# Patient Record
Sex: Female | Born: 1975 | Race: White | Hispanic: No | Smoking: Never smoker
Health system: Southern US, Community
[De-identification: ages and names within clinical notes are randomized; demographics above are authoritative.]

## PROBLEM LIST (undated history)

## (undated) DIAGNOSIS — Z8742 Personal history of other diseases of the female genital tract: Secondary | ICD-10-CM

## (undated) HISTORY — DX: Personal history of other diseases of the female genital tract: Z87.42

---

## 2010-01-18 ENCOUNTER — Ambulatory Visit (HOSPITAL_COMMUNITY): Admission: RE | Admit: 2010-01-18 | Discharge: 2010-01-18 | Payer: Self-pay | Admitting: Obstetrics and Gynecology

## 2013-01-18 ENCOUNTER — Observation Stay: Payer: Self-pay | Admitting: Obstetrics & Gynecology

## 2013-01-18 ENCOUNTER — Inpatient Hospital Stay: Payer: Self-pay | Admitting: Obstetrics and Gynecology

## 2013-01-18 LAB — CBC WITH DIFFERENTIAL/PLATELET
Basophil #: 0 10*3/uL (ref 0.0–0.1)
Eosinophil #: 0.1 10*3/uL (ref 0.0–0.7)
Eosinophil %: 0.8 %
HCT: 37 % (ref 35.0–47.0)
HGB: 12.3 g/dL (ref 12.0–16.0)
Lymphocyte #: 1.5 10*3/uL (ref 1.0–3.6)
Lymphocyte %: 12.7 %
MCHC: 33.3 g/dL (ref 32.0–36.0)
MCV: 85 fL (ref 80–100)
Neutrophil #: 9.3 10*3/uL — ABNORMAL HIGH (ref 1.4–6.5)
Neutrophil %: 80.7 %
Platelet: 138 10*3/uL — ABNORMAL LOW (ref 150–440)

## 2013-01-19 LAB — CBC WITH DIFFERENTIAL/PLATELET
Basophil #: 0 10*3/uL (ref 0.0–0.1)
Basophil %: 0.1 %
Basophil %: 0.1 %
Eosinophil %: 0.1 %
Eosinophil %: 0 %
HCT: 25.8 % — ABNORMAL LOW (ref 35.0–47.0)
HGB: 8.8 g/dL — ABNORMAL LOW (ref 12.0–16.0)
HGB: 8.3 g/dL — ABNORMAL LOW (ref 12.0–16.0)
Lymphocyte #: 0.8 10*3/uL — ABNORMAL LOW (ref 1.0–3.6)
Lymphocyte %: 6.4 %
Lymphocyte %: 3.8 %
MCH: 28.8 pg (ref 26.0–34.0)
MCHC: 33.9 g/dL (ref 32.0–36.0)
MCHC: 34.4 g/dL (ref 32.0–36.0)
Monocyte #: 1.4 x10 3/mm — ABNORMAL HIGH (ref 0.2–0.9)
Monocyte %: 6.5 %
Neutrophil #: 15.4 10*3/uL — ABNORMAL HIGH (ref 1.4–6.5)
RBC: 3.04 10*6/uL — ABNORMAL LOW (ref 3.80–5.20)
RDW: 13.7 % (ref 11.5–14.5)
WBC: 17.5 10*3/uL — ABNORMAL HIGH (ref 3.6–11.0)

## 2013-01-20 LAB — CBC WITH DIFFERENTIAL/PLATELET
Basophil #: 0 10*3/uL (ref 0.0–0.1)
HCT: 21.3 % — ABNORMAL LOW (ref 35.0–47.0)
HGB: 7.2 g/dL — ABNORMAL LOW (ref 12.0–16.0)
Lymphocyte %: 10.5 %
MCH: 28.7 pg (ref 26.0–34.0)
MCV: 85 fL (ref 80–100)
Monocyte #: 0.9 x10 3/mm (ref 0.2–0.9)
Neutrophil #: 11.3 10*3/uL — ABNORMAL HIGH (ref 1.4–6.5)
Platelet: 99 10*3/uL — ABNORMAL LOW (ref 150–440)
RDW: 14.1 % (ref 11.5–14.5)
WBC: 13.7 10*3/uL — ABNORMAL HIGH (ref 3.6–11.0)

## 2013-01-21 LAB — HEMATOCRIT: HCT: 20.3 % — ABNORMAL LOW (ref 35.0–47.0)

## 2014-12-29 NOTE — Op Note (Signed)
PATIENT NAME:  Anne Charles, Anne Charles MR#:  [id number] DATE OF BIRTH:  1976/07/30  DATE OF PROCEDURE:  01/19/2013  PREOPERATIVE DIAGNOSES: 1.  Intrauterine pregnancy at 38 weeks 6 days.  2.  History of cesarean section x 2.  3.  Actively laboring.  POSTOPERATIVE DIAGNOSES: 1.  Intrauterine pregnancy at 38 weeks 6 days.  2.  History of cesarean section x 2.  3.  Actively laboring.   INDICATIONS:  A 39 year old G3, P2-0-0-2 who was 4 cm with history of previous cesarean section x 2.   PROCEDURE:  Repeat low transverse cesarean section.   SURGEON:  Verlin Grills, M.D.   ANESTHESIA:  Spinal.   COMPLICATIONS:  None.   ESTIMATED BLOOD LOSS:  900 mL.   IV FLUIDS:  800 mL crystalloid.   URINE OUTPUT:  200 mL clear yellow urine.   FINDINGS:  Uterine window seen with exceedingly thin lower uterine segment at risk for dehiscence or rupture in a subsequent pregnancy.  Normal tubes and ovaries bilaterally, mild adhesive disease of fascia of the rectus, adhesive disease of bladder to lower uterine segment.  Liveborn female infant, Apgars 9 and 9.  No meconium.  No nuchal cord.  Weight 3110 grams.  Pediatrics present for delivery.   MEDICATIONS:  2 grams Ancef preoperatively and 20 units dilute Pitocin following placental delivery.   DISPOSITION:  Mother to recovery room, infant to newborn nursery, both in stable condition.   PROCEDURE IN DETAIL:  The patient was taken to the operating room where she was given anesthesia via spinal.  She is laid supine on the table, prepped and draped in the standard sterile fashion.  Prior to incision, level of anesthesia was checked and found to be adequate.  The patient's previous Pfannenstiel incision was noted to be very low and overlying the pubic symphysis, so decision was made to use a new incision 2 cm above the pubic symphysis.  This was made in the skin using the knife and carried down sharply to the level of the rectus fascia.  The fascia was then  incised in the midline.  The rectus fascial incision was extended bilaterally with Mayo scissors.  The superior border of the rectus fascia was then grasped and elevated off the underlying muscles, bluntly dissected.  The median raphe was taken down with Mayo scissors and attention was turned to the inferior border of the rectus fascia and it was taken down in a similar manner.  The rectus muscles were then separated in the midline.  The peritoneum identified, elevated with hemostats, and incised with Metzenbaum scissors.  Peritoneal opening was extended manually by pulling, followed by further sharp dissection with Mayo scissors.  The bladder blade was then inserted and the vesicouterine peritoneum was identified, elevated and incised with Metzenbaum scissors and this incision was extended bilaterally and superiorly with Metzenbaum.  The bladder flap was created and the bladder blade was replaced.  The operator's hand was inserted into the abdomen to find the uterus in a nonrotated position.  Next, a low transverse incision was made in the uterus using the knife.  With a single swipe the uterine cavity was entered due to the exceedingly thin lower uterine segment and uterine window as above.  The hysterotomy was extended bluntly.  The operator's hand was inserted into the uterus to find infant in vertex presentation.  The vertex was grasped, flexed and brought to the level of the incision where baby was delivered atraumatically using fundal pressure.  Cord was  doubly clamped and cut and the baby was passed to waiting pediatrician.  Next, the placenta was delivered with fundal pressure.  The uterus was exteriorized and the uterine cavity was wiped free of all clots and debris.  Next, the hysterotomy was repaired with 0 Vicryl stitch in a running lock fashion followed by a second 0 Vicryl stitch in a horizontal imbricating fashion.  A small extension on the left lateral aspect of the hysterotomy was also repaired  with a 0 Vicryl stitch in a figure-of-eight to control hemostasis.  Further milder areas of oozing were controlled with the Bovie.  Anatomy was then visualized with findings noted above.  The uterus was replaced into the abdomen and the paracolic gutters were suctioned bilaterally.  The anterior border of the rectus fascia was then grasped and On-Q catheters were placed infraumbilically without difficulty and allowed to rest between the rectus muscles and fascia.  Due to some persistent oozing at the area of the left lateral aspect of the hysterotomy 1 gram of Arista was used with improved hemostasis.  Next, the fascia was closed with #1 Vicryl stitch in a continuous fashion.  The incision was copiously irrigated.  Hemostasis was achieved using the Bovie.  Skin was closed using Insorb staples and bandaged with Steri-Strips.  On-Q catheters were Dermabonded in place and secured with Steri-Strips and Tegaderm.  All counts were correct x 2.  The patient tolerated the procedure well and was taken to the recovery room in stable condition.     ____________________________ Ali LoweEryn K. Garnette GunnerStansbury Clipp, MD eks:ea D: 01/19/2013 01:41:18 ET T: 01/19/2013 02:35:06 ET JOB#: [id number]  cc: Weston SettleEryn K. Garnette GunnerStansbury Clipp, MD, <Dictator> Weston SettleERYN Lona KettleK STANSBURY CLIPP MD ELECTRONICALLY SIGNED 01/19/2013 5:35

## 2015-01-16 NOTE — H&P (Signed)
L&D Evaluation:  History:  HPI 39yo G3P2002 at 5731w5d by 1st trimester U/S presents with worsening contractions through the day today.  [facility]   Presents with contractions   Patient's Medical History No Chronic Illness   Patient's Surgical History Previous C-Section  x2   Medications Pre Natal Vitamins   Allergies NKDA   Social History none   Family History Non-Contributory   ROS:  ROS All systems were reviewed.  HEENT, CNS, GI, GU, Respiratory, CV, Renal and Musculoskeletal systems were found to be normal.   Exam:  Vital Signs 1st BP mildly elevated   General mild distress with contractions   Mental Status clear   Chest normal effort   Abdomen gravid, non-tender   Estimated Fetal Weight Average for gestational age   Pelvic 3-4cm / 5570 / -4   Mebranes Intact   FHT normal rate with no decels   Ucx every 3-485min   Skin dry   Lymph no lymphadenopathy   Impression:  Impression active labor, h/o C/S x 2   LTCS Consent: I have had an exceedingly long and careful discussion with this patient about her circumstances and options available. She understands that I have carefully evaluated the infant's fetal heart rate pattern, the probable time remaining in her labor and her reserve. We are at a point where one of them will need to take a potential risk, either she take the risk of a cesarean section or the infant take a risk that the fetal intolerance to labor is very significant with the potential for a real effect on the baby which will worsen if we allow labor to continue.  She also understands that interpretation of the FHT is not a precise science and that someone else might allow labor to continue for the present. Mutual decision made to proceed with abdominal surgery.  Fully informed consent obtained including the risks of anesthesia, hemorrhage, infection, injury to adjacent structures, bowel, bladder, and blood vessels..  Electronic Signatures: Garnette GunnerStansbury  Clipp, Ali LoweEryn K (MD)  (Signed 13-May-14 23:04)  Authored: L&D Evaluation, Consent   Last Updated: 13-May-14 23:04 by Garnette GunnerStansbury Clipp, Ali LoweEryn K (MD)

## 2017-05-22 ENCOUNTER — Ambulatory Visit: Payer: Self-pay | Admitting: Advanced Practice Midwife

## 2017-06-26 ENCOUNTER — Ambulatory Visit: Payer: Self-pay | Admitting: Certified Nurse Midwife

## 2017-07-28 ENCOUNTER — Ambulatory Visit: Payer: Self-pay | Admitting: Certified Nurse Midwife

## 2017-10-13 ENCOUNTER — Ambulatory Visit: Payer: Self-pay | Admitting: Obstetrics and Gynecology

## 2017-10-30 ENCOUNTER — Encounter: Payer: Self-pay | Admitting: Obstetrics and Gynecology

## 2017-10-30 ENCOUNTER — Ambulatory Visit (INDEPENDENT_AMBULATORY_CARE_PROVIDER_SITE_OTHER): Admitting: Obstetrics and Gynecology

## 2017-10-30 DIAGNOSIS — Z01419 Encounter for gynecological examination (general) (routine) without abnormal findings: Secondary | ICD-10-CM

## 2017-10-30 DIAGNOSIS — Z1231 Encounter for screening mammogram for malignant neoplasm of breast: Secondary | ICD-10-CM

## 2017-10-30 DIAGNOSIS — Z124 Encounter for screening for malignant neoplasm of cervix: Secondary | ICD-10-CM | POA: Diagnosis not present

## 2017-10-30 DIAGNOSIS — Z1239 Encounter for other screening for malignant neoplasm of breast: Secondary | ICD-10-CM

## 2017-10-30 NOTE — Patient Instructions (Signed)
Preventive Care 18-39 Years, Female Preventive care refers to lifestyle choices and visits with your health care provider that can promote health and wellness. What does preventive care include?  A yearly physical exam. This is also called an annual well check.  Dental exams once or twice a year.  Routine eye exams. Ask your health care provider how often you should have your eyes checked.  Personal lifestyle choices, including: ? Daily care of your teeth and gums. ? Regular physical activity. ? Eating a healthy diet. ? Avoiding tobacco and drug use. ? Limiting alcohol use. ? Practicing safe sex. ? Taking vitamin and mineral supplements as recommended by your health care provider. What happens during an annual well check? The services and screenings done by your health care provider during your annual well check will depend on your age, overall health, lifestyle risk factors, and family history of disease. Counseling Your health care provider may ask you questions about your:  Alcohol use.  Tobacco use.  Drug use.  Emotional well-being.  Home and relationship well-being.  Sexual activity.  Eating habits.  Work and work Statistician.  Method of birth control.  Menstrual cycle.  Pregnancy history.  Screening You may have the following tests or measurements:  Height, weight, and BMI.  Diabetes screening. This is done by checking your blood sugar (glucose) after you have not eaten for a while (fasting).  Blood pressure.  Lipid and cholesterol levels. These may be checked every 5 years starting at age 66.  Skin check.  Hepatitis C blood test.  Hepatitis B blood test.  Sexually transmitted disease (STD) testing.  BRCA-related cancer screening. This may be done if you have a family history of breast, ovarian, tubal, or peritoneal cancers.  Pelvic exam and Pap test. This may be done every 3 years starting at age 40. Starting at age 59, this may be done every 5  years if you have a Pap test in combination with an HPV test.  Discuss your test results, treatment options, and if necessary, the need for more tests with your health care provider. Vaccines Your health care provider may recommend certain vaccines, such as:  Influenza vaccine. This is recommended every year.  Tetanus, diphtheria, and acellular pertussis (Tdap, Td) vaccine. You may need a Td booster every 10 years.  Varicella vaccine. You may need this if you have not been vaccinated.  HPV vaccine. If you are 69 or younger, you may need three doses over 6 months.  Measles, mumps, and rubella (MMR) vaccine. You may need at least one dose of MMR. You may also need a second dose.  Pneumococcal 13-valent conjugate (PCV13) vaccine. You may need this if you have certain conditions and were not previously vaccinated.  Pneumococcal polysaccharide (PPSV23) vaccine. You may need one or two doses if you smoke cigarettes or if you have certain conditions.  Meningococcal vaccine. One dose is recommended if you are age 27-21 years and a first-year college student living in a residence hall, or if you have one of several medical conditions. You may also need additional booster doses.  Hepatitis A vaccine. You may need this if you have certain conditions or if you travel or work in places where you may be exposed to hepatitis A.  Hepatitis B vaccine. You may need this if you have certain conditions or if you travel or work in places where you may be exposed to hepatitis B.  Haemophilus influenzae type b (Hib) vaccine. You may need this if  you have certain risk factors.  Talk to your health care provider about which screenings and vaccines you need and how often you need them. This information is not intended to replace advice given to you by your health care provider. Make sure you discuss any questions you have with your health care provider. Document Released: 10/21/2001 Document Revised: 05/14/2016  Document Reviewed: 06/26/2015 Elsevier Interactive Patient Education  [employer].

## 2017-10-30 NOTE — Progress Notes (Signed)
Patient ID: Anne Charles, female   DOB: November 11, 1975, 42 y.o.   MRN: [id number]     Gynecology Annual Exam   PCP: Patient, No Pcp Per  Chief Complaint:  Chief Complaint  Patient presents with  . Gynecologic Exam    History of Present Illness: Patient is a 42 y.o. G3P3003 presents for annual exam. The patient has no complaints today.   LMP: Patient's last menstrual period was 10/04/2017 (exact date). Average Interval: regular, 28 days Duration of flow: 5 days Heavy Menses: no Clots: no Intermenstrual Bleeding: no Postcoital Bleeding: no Dysmenorrhea: no  The patient is sexually active. She currently uses natural family planning for contraception. She denies dyspareunia.  The patient does perform self breast exams.  There is no notable family history of breast or ovarian cancer in her family.  The patient wears seatbelts: yes.   The patient has regular exercise: yes.    The patient denies current symptoms of depression.    Review of Systems: Review of Systems  Constitutional: Negative for chills and fever.  HENT: Negative for congestion.   Respiratory: Negative for cough and shortness of breath.   Cardiovascular: Negative for chest pain and palpitations.  Gastrointestinal: Negative for abdominal pain, constipation, diarrhea, heartburn, nausea and vomiting.  Genitourinary: Negative for dysuria, frequency and urgency.  Skin: Negative for itching and rash.  Neurological: Negative for dizziness and headaches.  Endo/Heme/Allergies: Negative for polydipsia.  Psychiatric/Behavioral: Negative for depression.    Past Medical History:  Past Medical History:  Diagnosis Date  . History of abnormal cervical Pap smear    LGSIL    Past Surgical History:  Past Surgical History:  Procedure Laterality Date  . CESAREAN SECTION      Gynecologic History:  Patient's last menstrual period was 10/04/2017 (exact date). 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 Cervical cancer Mother 40  . Hypertension Father     Social History:  Social History   Socioeconomic History  . Marital status: Unknown    Spouse name: Not on file  . Number of children: Not on file  . Years of education: Not on file  . Highest education level: Not on file  Social Needs  . Financial resource strain: Not on file  . Food insecurity - worry: Not on file  . Food insecurity - inability: Not on file  . Transportation needs - medical: Not on file  . Transportation needs - non-medical: Not on file  Occupational History  . Not on file  Tobacco Use  . Smoking status: Never Smoker  . Smokeless tobacco: Never Used  Substance and Sexual Activity  . Alcohol use: No    Frequency: Never  . Drug use: No  . Sexual activity: Yes    Birth control/protection: None  Other Topics Concern  . Not on file  Social History Narrative   Active duty military - army    Allergies:  No Known Allergies  Medications: Prior to Admission medications   Not on File    Physical Exam Vitals: Blood pressure 120/70, pulse 100, weight 148 lb (67.1 kg), last menstrual period 10/04/2017.  General: NAD HEENT: normocephalic, anicteric Thyroid: no enlargement, no palpable nodules Pulmonary: No increased work of breathing, CTAB  Cardiovascular: RRR, distal pulses 2+ Breast: Breast symmetrical, no tenderness, no palpable nodules or masses, no skin or nipple retraction present, no nipple discharge.  No axillary or supraclavicular lymphadenopathy. Abdomen: NABS, soft, non-tender, non-distended.  Umbilicus without lesions.  No hepatomegaly, splenomegaly or masses palpable. No evidence of hernia   Genitourinary:  External: Normal external female genitalia.  Normal urethral meatus, normal  Bartholin's and Skene's glands.    Vagina: Normal vaginal mucosa, no evidence of prolapse.    Cervix: Grossly normal in appearance, no bleeding  Uterus: Non-enlarged, mobile, normal contour.  No CMT  Adnexa: ovaries non-enlarged, no adnexal masses  Rectal: deferred  Lymphatic: no evidence of inguinal lymphadenopathy Extremities: no edema, erythema, or tenderness Neurologic: Grossly intact Psychiatric: mood appropriate, affect full  Female chaperone present for pelvic and breast  portions of the physical exam    Assessment: 42 y.o. G3P3003 routine annual exam  Plan: Problem List Items Addressed This Visit    None    Visit Diagnoses    Screening for malignant neoplasm of cervix       Relevant Orders   PapIG, HPV, rfx 16/18   Breast screening       Relevant Orders   MM DIGITAL SCREENING BILATERAL   Encounter for gynecological examination without abnormal finding          1) STI screening was not offered  2)  ASCCP guidelines and rational discussed.  Patient opts for yearly screening interval  3) Contraception - the patient is currently using  rhythm method.  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   Vena AustriaAndreas Tifanny Dollens, MD, Evern CoreFACOG [facility], [facility] 10/30/2017, 6:13 PM

## 2017-11-06 ENCOUNTER — Encounter: Payer: Self-pay | Admitting: Obstetrics and Gynecology

## 2017-11-06 LAB — PAPIG, HPV, RFX 16/18
HPV GENOTYPE, 18: NEGATIVE
HPV Genotype, 16: NEGATIVE
HPV, high-risk: POSITIVE — AB
PAP Smear Comment: 0

## 2017-11-27 ENCOUNTER — Ambulatory Visit (INDEPENDENT_AMBULATORY_CARE_PROVIDER_SITE_OTHER): Admitting: Obstetrics and Gynecology

## 2017-11-27 ENCOUNTER — Encounter: Payer: Self-pay | Admitting: Obstetrics and Gynecology

## 2017-11-27 VITALS — BP 102/62 | HR 76 | Ht 71.0 in | Wt 150.0 lb

## 2017-11-27 DIAGNOSIS — B977 Papillomavirus as the cause of diseases classified elsewhere: Secondary | ICD-10-CM | POA: Diagnosis not present

## 2017-11-27 DIAGNOSIS — N72 Inflammatory disease of cervix uteri: Secondary | ICD-10-CM | POA: Diagnosis not present

## 2017-11-27 NOTE — Progress Notes (Signed)
   GYNECOLOGY CLINIC COLPOSCOPY PROCEDURE NOTE  42 y.o. [id number] here for colposcopy for NIL and HR HPV+  pap smear on 11/06/17 (subtyping HPV 16 and 18 negative). Discussed underlying role for HPV infection in the development of cervical dysplasia, its natural history and progression/regression, need for surveillance.  Is the patient  pregnant: No LMP: Patient's last menstrual period was 11/01/2017. Smoking status:  reports that she has never smoked. She has never used smokeless tobacco. Contraception: abstinence Number current sexual partners:  1 High risk partner:No History of STD:  No Future fertility desired:  No  Patient given informed consent, signed copy in the chart, time out was performed.  The patient was position in dorsal lithotomy position. Speculum was placed the cervix was visualized.   After application of acetic acid colposcopic inspection of the cervix was undertaken.   Colposcopy adequate, full visualization of transformation zone: Yes acetowhite lesion(s) noted at 11-12 O'Clock o'clock; corresponding biopsies obtained.   ECC specimen obtained:  Yes  All specimens were labeled and sent to pathology.   Patient was given post procedure instructions.  Will follow up pathology and manage accordingly.  Routine preventative health maintenance measures emphasized.  OBGyn Exam  Vena AustriaAndreas Yisel Megill, MD, Merlinda FrederickFACOG [facility], [facility]

## 2017-12-02 LAB — PATHOLOGY

## 2017-12-04 ENCOUNTER — Encounter: Payer: Self-pay | Admitting: Obstetrics and Gynecology

## 2018-07-07 ENCOUNTER — Other Ambulatory Visit: Payer: Self-pay | Admitting: Obstetrics and Gynecology

## 2018-07-07 ENCOUNTER — Ambulatory Visit
Admission: RE | Admit: 2018-07-07 | Discharge: 2018-07-07 | Disposition: A | Discharge status: Home / Self Care | Source: Ambulatory Visit | Attending: Obstetrics and Gynecology | Admitting: Obstetrics and Gynecology

## 2018-07-07 DIAGNOSIS — R928 Other abnormal and inconclusive findings on diagnostic imaging of breast: Secondary | ICD-10-CM

## 2018-07-07 DIAGNOSIS — N6310 Unspecified lump in the right breast, unspecified quadrant: Secondary | ICD-10-CM

## 2018-07-07 DIAGNOSIS — Z1239 Encounter for other screening for malignant neoplasm of breast: Secondary | ICD-10-CM | POA: Insufficient documentation

## 2018-07-16 ENCOUNTER — Ambulatory Visit
Admission: RE | Admit: 2018-07-16 | Discharge: 2018-07-16 | Disposition: A | Discharge status: Home / Self Care | Source: Ambulatory Visit | Attending: Obstetrics and Gynecology | Admitting: Obstetrics and Gynecology

## 2018-07-16 DIAGNOSIS — N6310 Unspecified lump in the right breast, unspecified quadrant: Secondary | ICD-10-CM | POA: Insufficient documentation

## 2018-07-16 DIAGNOSIS — R928 Other abnormal and inconclusive findings on diagnostic imaging of breast: Secondary | ICD-10-CM | POA: Insufficient documentation

## 2019-02-03 ENCOUNTER — Other Ambulatory Visit: Payer: Self-pay | Admitting: Obstetrics and Gynecology

## 2019-02-03 DIAGNOSIS — N6310 Unspecified lump in the right breast, unspecified quadrant: Secondary | ICD-10-CM

## 2019-02-24 ENCOUNTER — Other Ambulatory Visit

## 2019-02-28 ENCOUNTER — Other Ambulatory Visit: Payer: Self-pay

## 2019-02-28 ENCOUNTER — Ambulatory Visit
Admission: RE | Admit: 2019-02-28 | Discharge: 2019-02-28 | Disposition: A | Discharge status: Home / Self Care | Source: Ambulatory Visit | Attending: Obstetrics and Gynecology | Admitting: Obstetrics and Gynecology

## 2019-02-28 DIAGNOSIS — N6310 Unspecified lump in the right breast, unspecified quadrant: Secondary | ICD-10-CM | POA: Diagnosis present

## 2019-07-25 IMAGING — US ULTRASOUND RIGHT BREAST LIMITED
1 series · 7 of 7 positions shown · non-contrast
Comparison: Baseline screening mammogram dated 07/07/2018.

CLINICAL DATA: Patient returns today to evaluate a RIGHT breast
mass identified on recent baseline screening mammogram.

EXAM:
ULTRASOUND OF THE RIGHT BREAST

[Series 1: ultrasound right breast limited · 0.06mm/px · 7 of 7 slices shown]
[im 1/7]
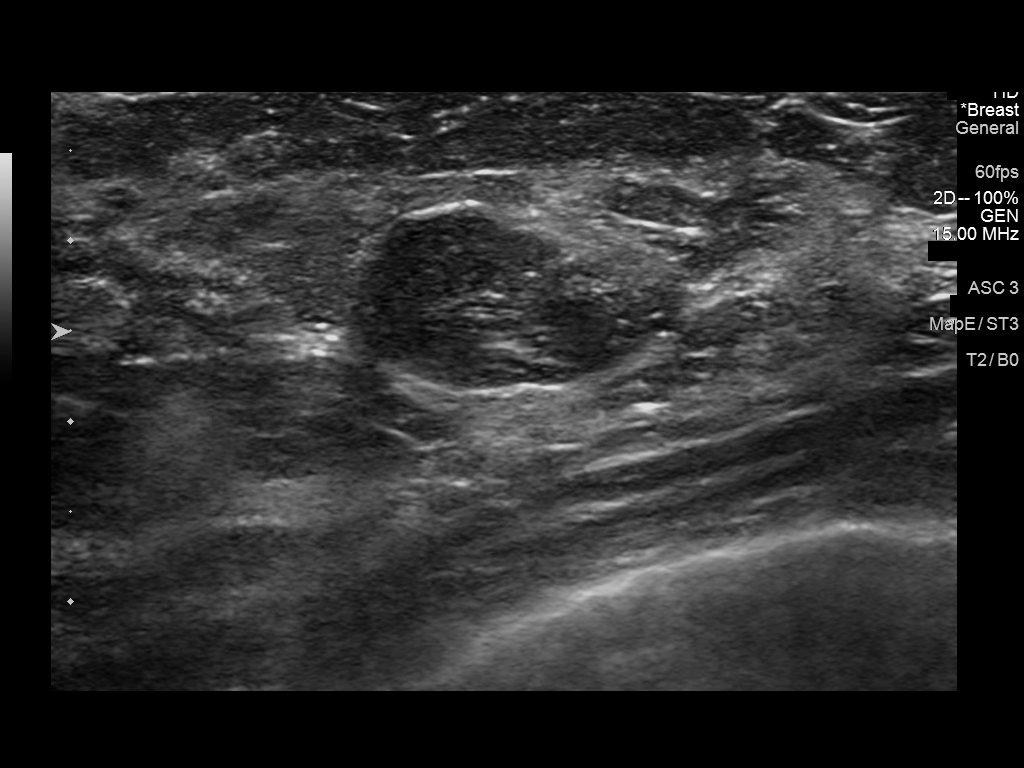
[im 2/7]
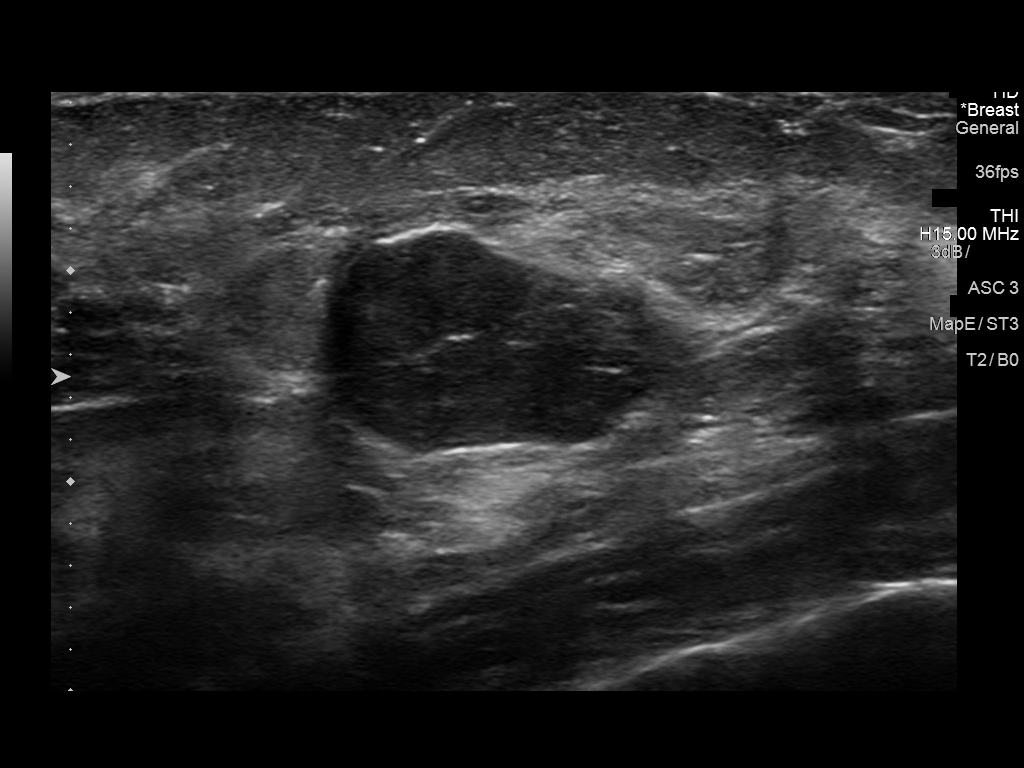
[im 3/7]
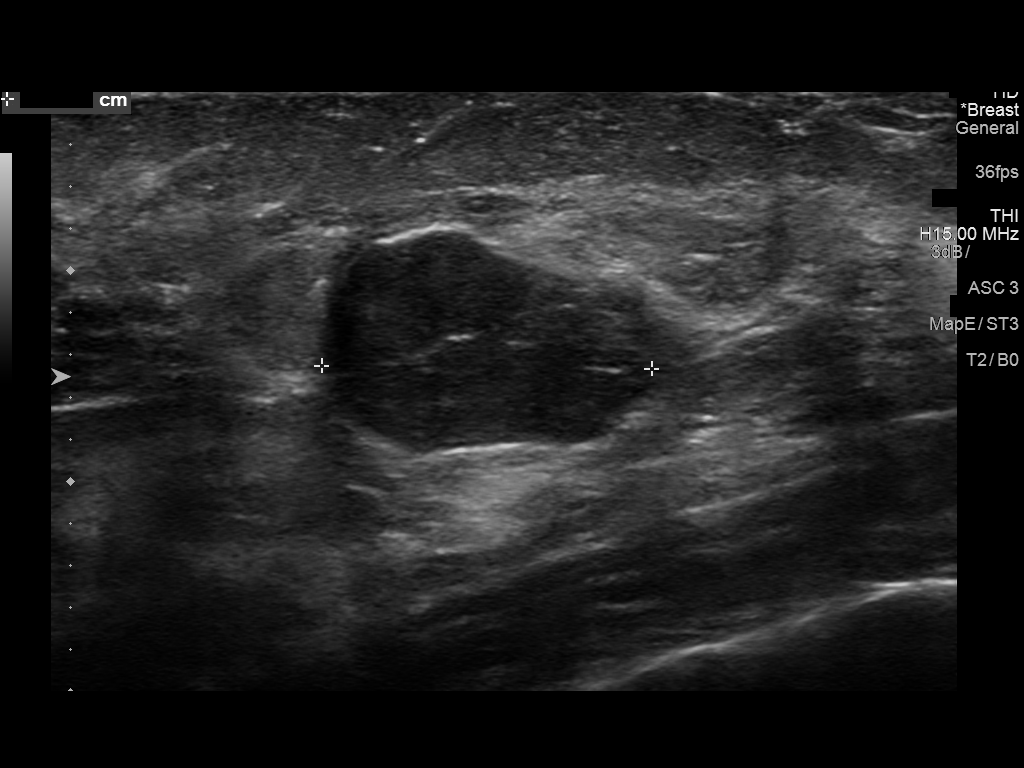
[im 4/7]
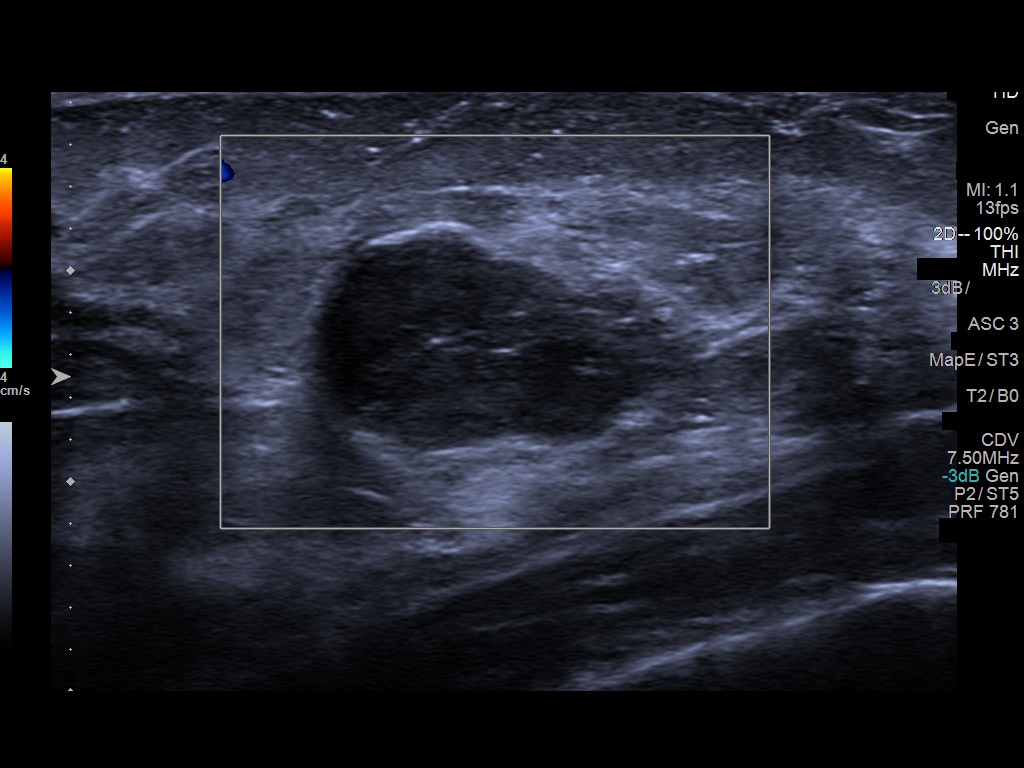
[im 5/7]
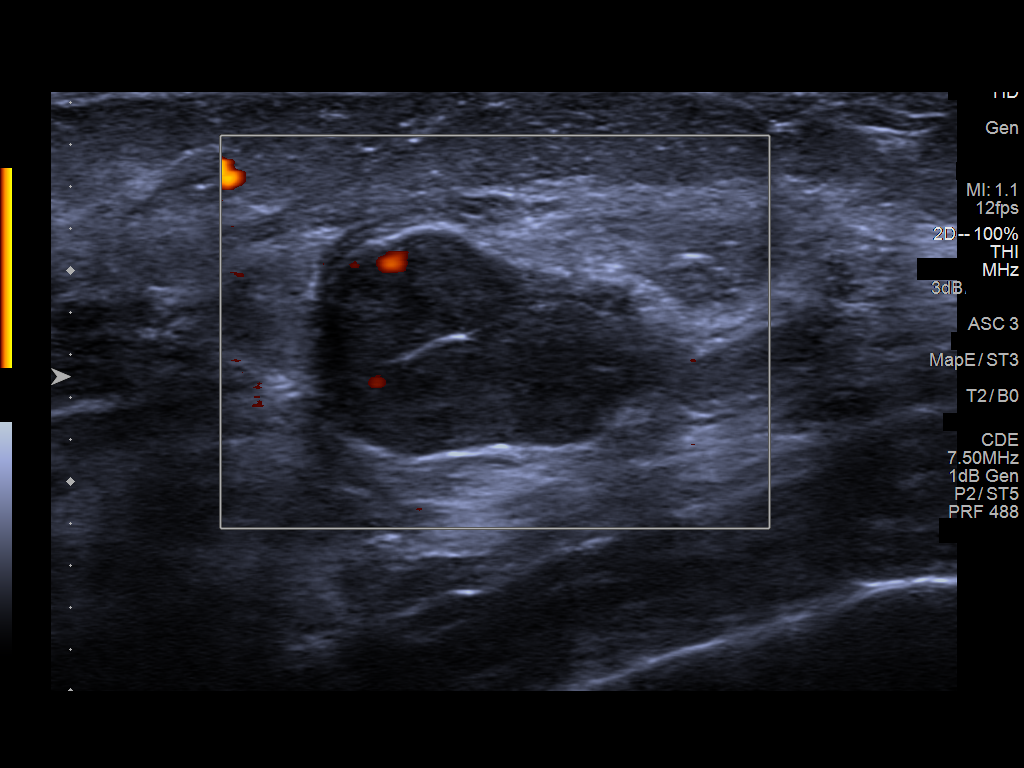
[im 6/7]
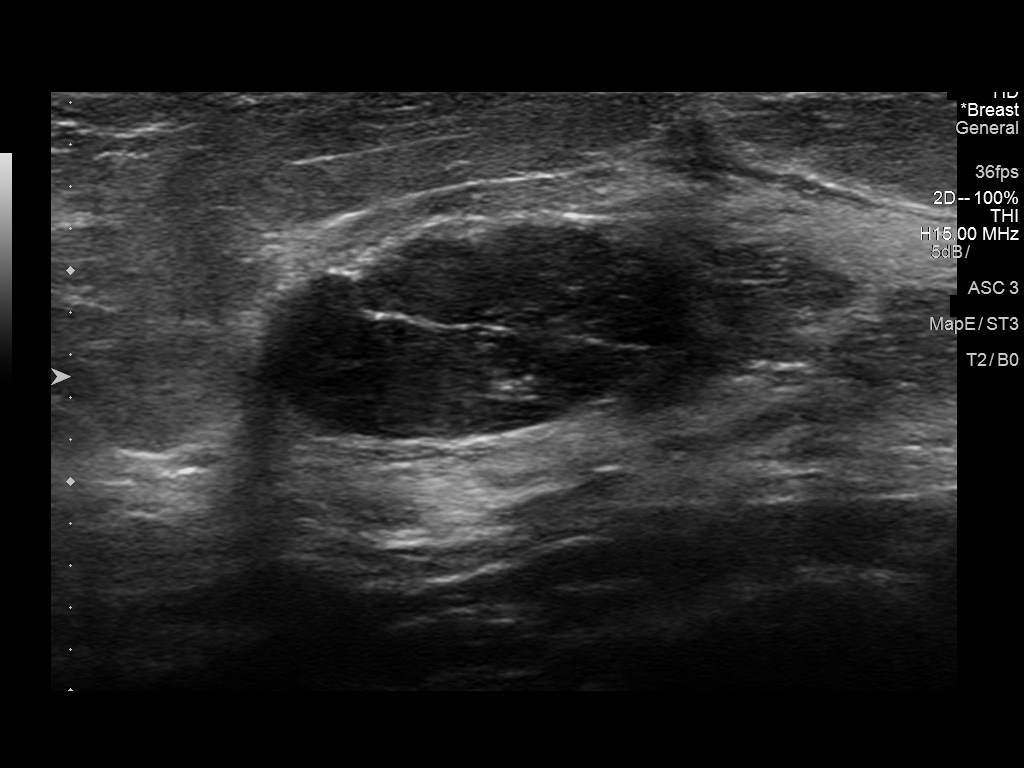
[im 7/7]
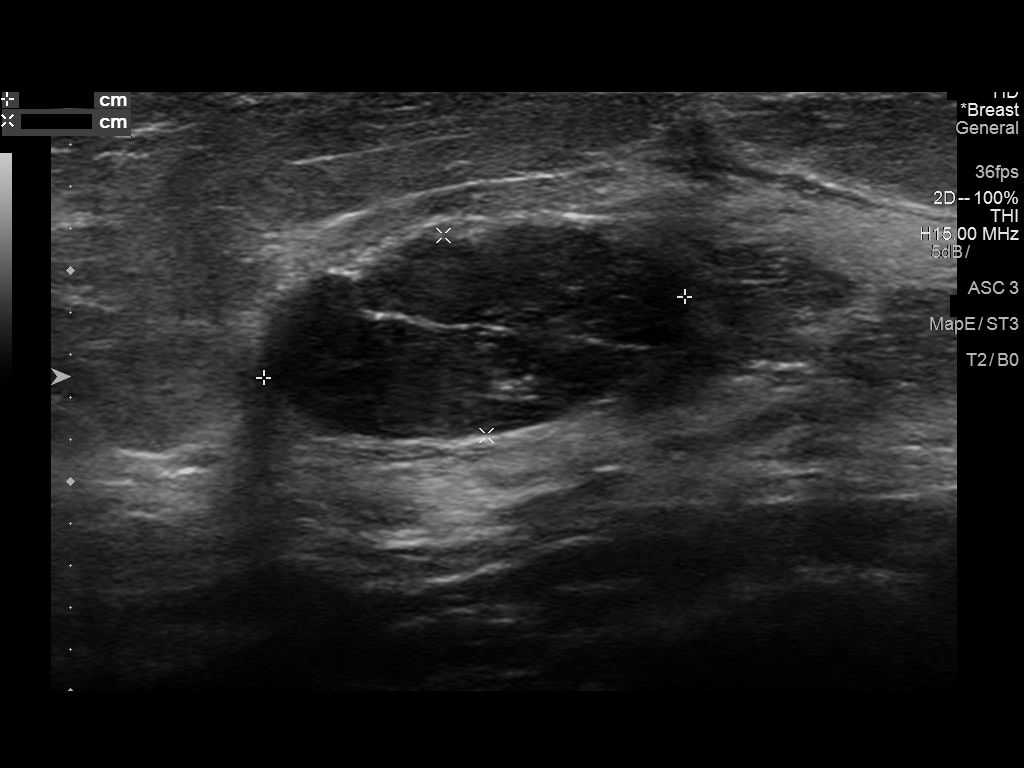

[7 of 7 positions shown; findings below may reference images not displayed]

FINDINGS: Targeted ultrasound is performed, showing an oval circumscribed
hypoechoic mass in the RIGHT breast at the 4 o'clock axis, 3 cm from
the nipple, measuring 2 x 1 x 1.6 cm, with internal septation,
corresponding to the mammographic finding, most suggestive of a
benign fibroadenoma.
IMPRESSION: Probably benign fibroadenoma within the RIGHT breast at the 4
o'clock axis, 3 cm from the nipple, measuring 2 x 1 x 1.6 cm,
corresponding to the finding on recent baseline screening mammogram.
Recommend follow-up RIGHT breast ultrasound in 6 months to ensure
stability.

RECOMMENDATION:
RIGHT breast ultrasound in 6 months.

I have discussed the findings and recommendations with the patient.
Results were also provided in writing at the conclusion of the
visit. If applicable, a reminder letter will be sent to the patient
regarding the next appointment.

BI-RADS CATEGORY  3: Probably benign.

## 2019-09-14 IMAGING — US ULTRASOUND RIGHT BREAST LIMITED
1 series · 6 of 6 positions shown · non-contrast
Comparison: Previous exam(s).

CLINICAL DATA: Patient presents for a diagnostic right breast
ultrasound to follow-up a probable benign mass at the 4 o'clock
position.

EXAM:
ULTRASOUND OF THE RIGHT BREAST

[Series 1: ultrasound right breast limited · 0.06mm/px · 6 of 6 slices shown]
[im 1/6]
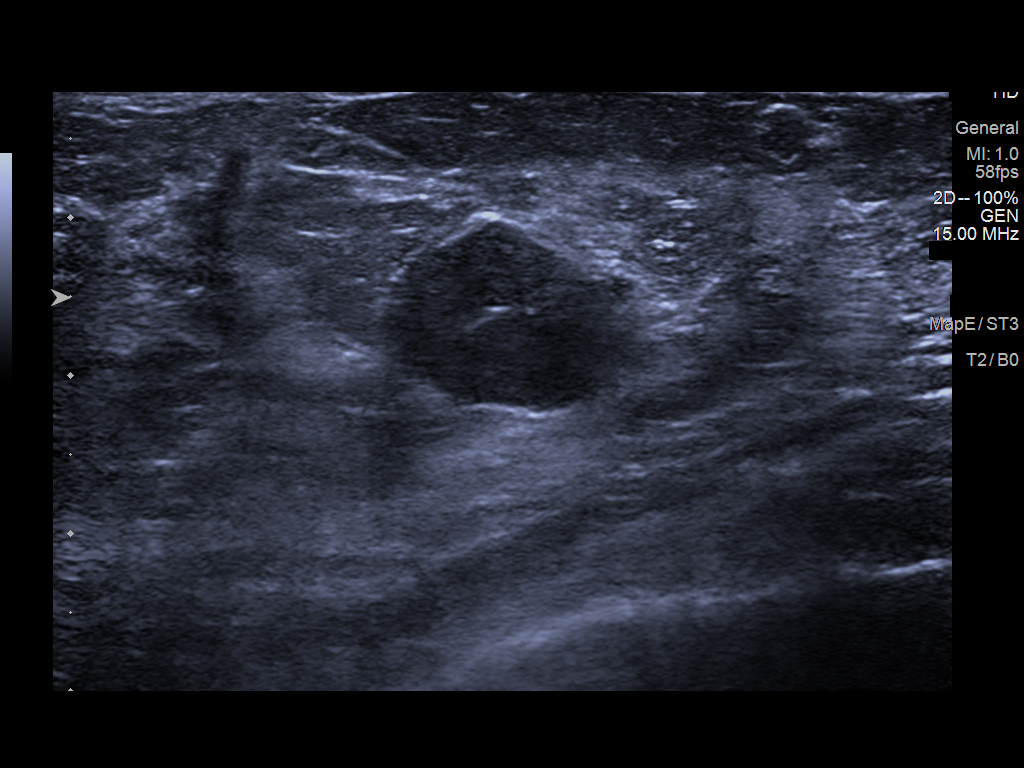
[im 2/6]
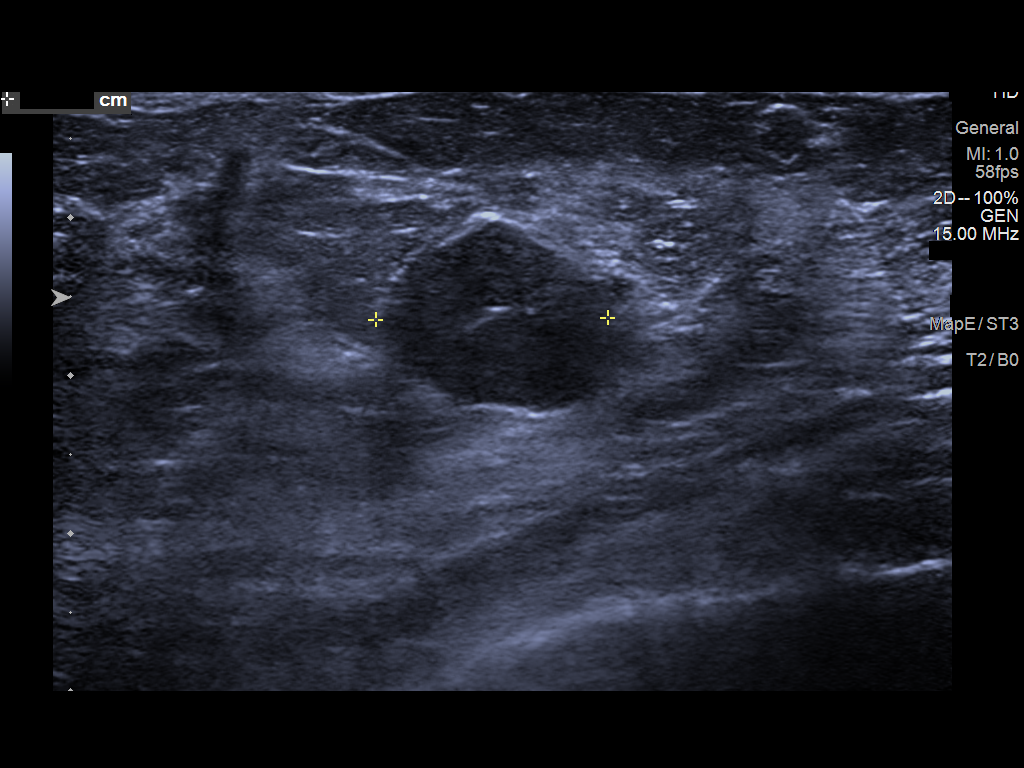
[im 3/6]
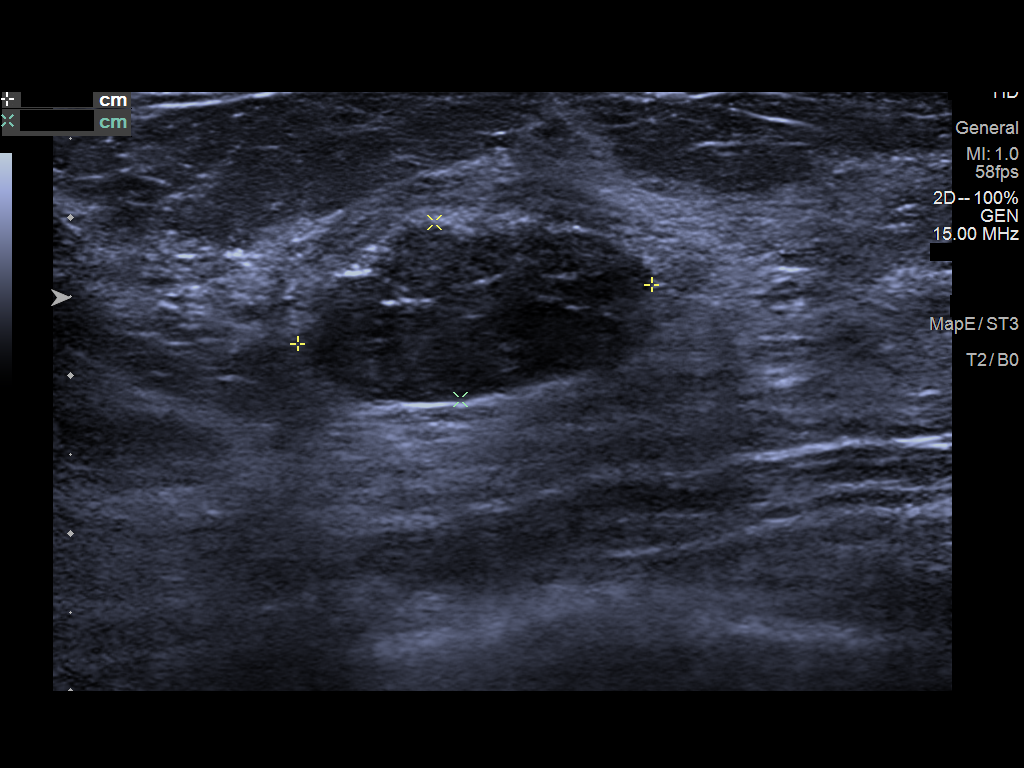
[im 4/6]
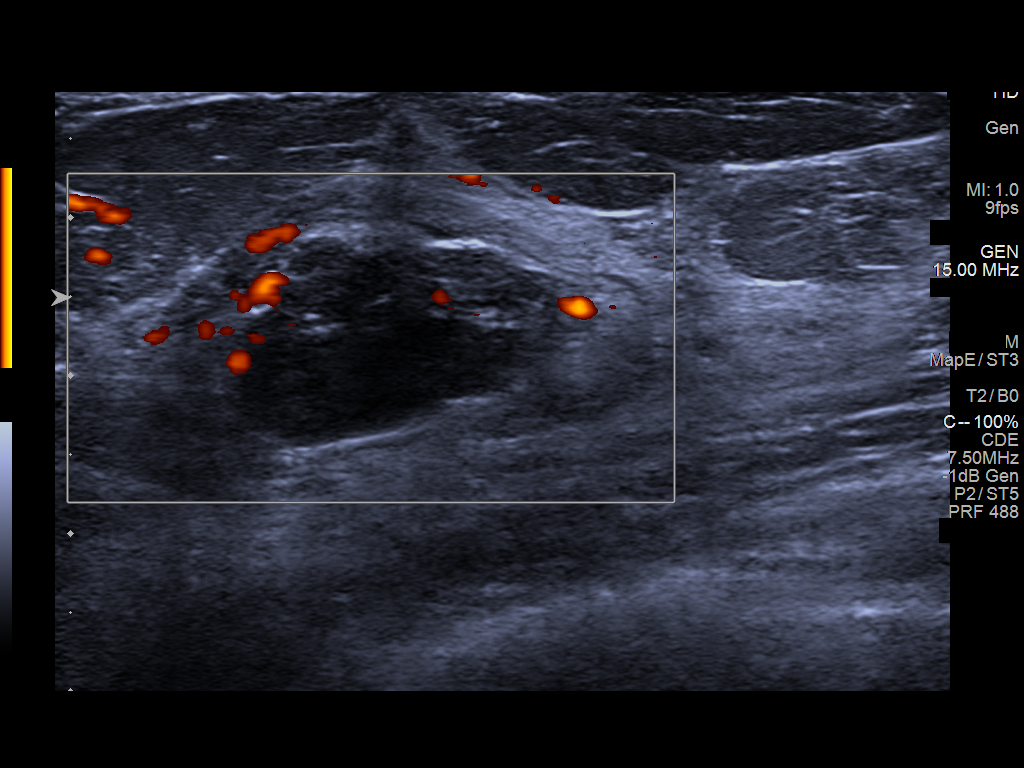
[im 5/6]
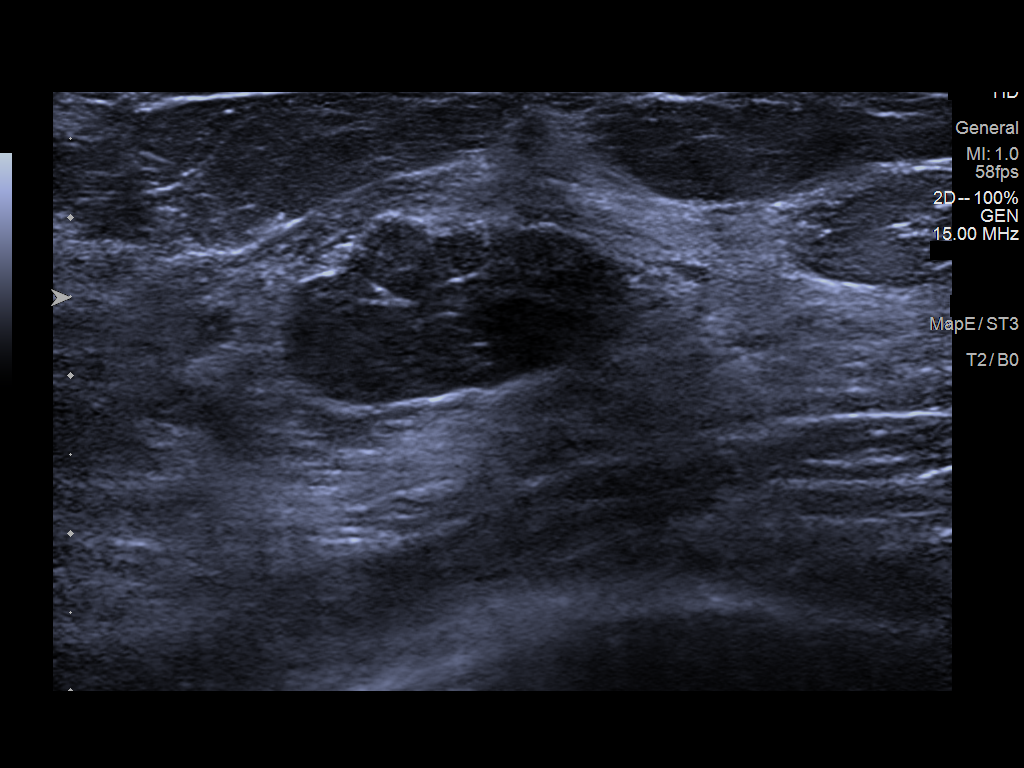
[im 6/6]
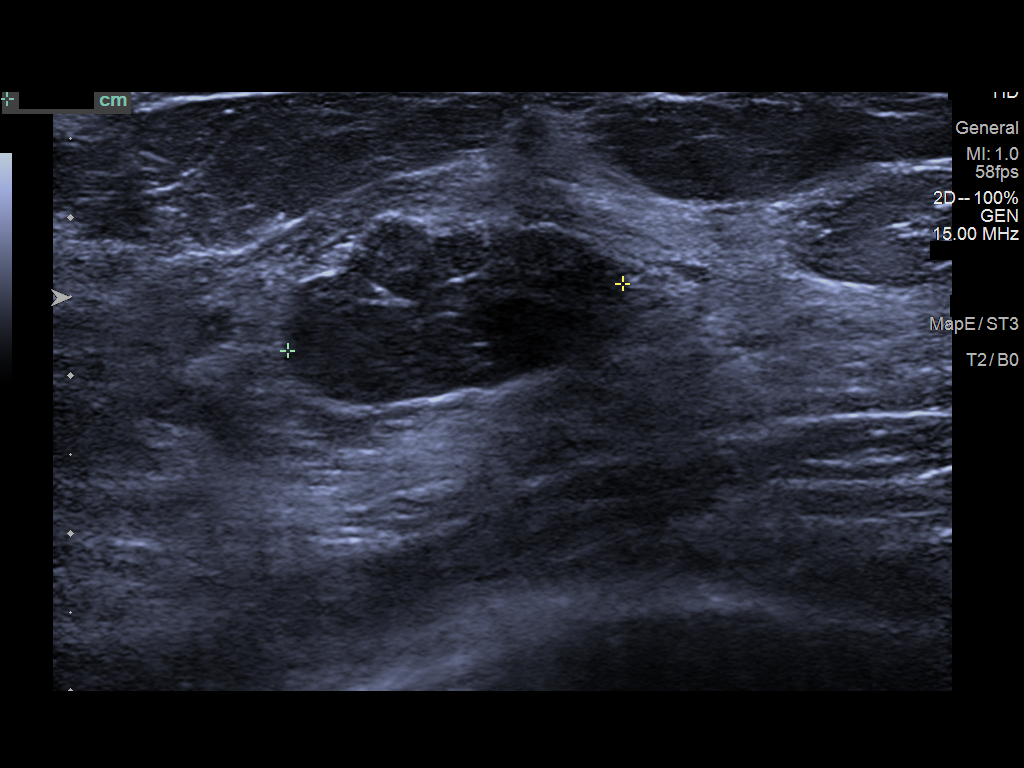

[6 of 6 positions shown; findings below may reference images not displayed]

FINDINGS: Targeted ultrasound is performed, showing continued evidence of an
oval circumscribed hypoechoic mass over the 4 o'clock position of
the right breast 3 cm from the nipple with a few thin septations and
mild increased through transmission. This mass measures 1.1 x 2.2 x
2.3 cm and is not significantly changed allowing for differences in
scan plane measurement. This likely represents a fibroadenoma.
IMPRESSION: No significant change in a probable benign mass over the 4 o'clock
position of the right breast likely a fibroadenoma.

RECOMMENDATION:
Recommend an additional six-month follow-up diagnostic right breast
ultrasound at the time of patient's annual bilateral mammogram in
June 2019 to document continued stability.

I have discussed the findings and recommendations with the patient.
Results were also provided in writing at the conclusion of the
visit. If applicable, a reminder letter will be sent to the patient
regarding the next appointment.

BI-RADS CATEGORY  3: Probably benign.

## 2020-08-24 ENCOUNTER — Ambulatory Visit: Admitting: Obstetrics and Gynecology

## 2020-09-03 ENCOUNTER — Other Ambulatory Visit (HOSPITAL_COMMUNITY)
Admission: RE | Admit: 2020-09-03 | Discharge: 2020-09-03 | Disposition: A | Discharge status: Home / Self Care | Source: Ambulatory Visit | Attending: Obstetrics and Gynecology | Admitting: Obstetrics and Gynecology

## 2020-09-03 ENCOUNTER — Encounter: Payer: Self-pay | Admitting: Obstetrics

## 2020-09-03 ENCOUNTER — Ambulatory Visit (INDEPENDENT_AMBULATORY_CARE_PROVIDER_SITE_OTHER): Admitting: Obstetrics

## 2020-09-03 ENCOUNTER — Other Ambulatory Visit: Payer: Self-pay

## 2020-09-03 VITALS — BP 110/70 | HR 96 | Ht 70.0 in | Wt 167.0 lb

## 2020-09-03 DIAGNOSIS — Z1231 Encounter for screening mammogram for malignant neoplasm of breast: Secondary | ICD-10-CM | POA: Diagnosis not present

## 2020-09-03 DIAGNOSIS — Z01419 Encounter for gynecological examination (general) (routine) without abnormal findings: Secondary | ICD-10-CM | POA: Insufficient documentation

## 2020-09-03 DIAGNOSIS — Z124 Encounter for screening for malignant neoplasm of cervix: Secondary | ICD-10-CM | POA: Diagnosis present

## 2020-09-03 NOTE — Progress Notes (Signed)
Gynecology Annual Exam  PCP: [facility] [place], [place]  Chief Complaint:  Chief Complaint  Patient presents with  . Annual Exam    History of Present Illness: Anne Charles is a 44 y.o. G3P3003 presents for annual exam. The patient has no complaints today. 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 Her husband is also a Occupational hygienist. They have three children.   Her menses are regular, they occur every month, and they last 5  days. Her flow is moderate. She does not have intermenstrual bleeding. Her last menstrual period was 08/20/2020. She denies dysmenorrhea. Last pap smear: 10/30/2017, results were NILM   The patient is  sexually active. She currently uses fertility awareness or Natural Family Planning for contraception. She does not have dyspareunia.  Since her last visit, she has had no significant changes in her health.  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  The patient does perform self breast exams. 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 This continues ot be followed with mammograms.  There is no family history of breast cancer. Genetic testing has not been done.   There is no family history of ovarian cancer. Genetic testing has not been done.  The patient denies smoking.  She reports drinking alcohol. She reports have drinks socially. drinks per week.   She denies illegal drug use.  The patient reports exercising regularly.  The patient denies current symptoms of depression.    Review of Systems: Review of Systems  Constitutional: Negative.   Eyes: Negative.   Cardiovascular: Negative.   Skin: Negative.   Neurological: Negative.   Endo/Heme/Allergies: Negative.   Psychiatric/Behavioral: Negative.     Past Medical History:  Past Medical History:  Diagnosis Date  . History of abnormal cervical Pap smear     LGSIL    Past Surgical History:  Past Surgical History:  Procedure Laterality Date  . CESAREAN SECTION      Family History:  Family History  Problem Relation Age of Onset  . Cervical cancer Mother 4  . Hypertension Father   . Breast cancer Neg Hx     Social History:  Social History   Socioeconomic History  . Marital status: Unknown    Spouse name: Not on file  . Number of children: Not on file  . Years of education: Not on file  . Highest education level: Not on file  Occupational History  . Not on file  Tobacco Use  . Smoking status: Never Smoker  . Smokeless tobacco: Never Used  Vaping Use  . Vaping Use: Never used  Substance and Sexual Activity  . Alcohol use: No  . Drug use: No  . Sexual activity: Yes    Birth control/protection: None  Other Topics Concern  . 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  General: NAD HEENT: normocephalic, anicteric Neck: no thyroid enlargement, no palpable nodules, no cervical lymphadenopathy  Pulmonary: No increased work of breathing, CTAB Cardiovascular: RRR, without murmur  Breast: Breast symmetrical, no tenderness, no palpable nodules or masses, no skin or nipple retraction present, no nipple discharge. Nipples are inverted.  No axillary, infraclavicular or supraclavicular lymphadenopathy. Abdomen: Soft, non-tender, non-distended.  Umbilicus without lesions.  No hepatomegaly or masses  palpable. No evidence of hernia. Genitourinary:  External: Normal external female genitalia.  Normal urethral meatus, normal  Bartholin's and Skene's glands.    Vagina: Normal vaginal mucosa, no evidence of prolapse.    Cervix: Grossly normal in appearance, no bleeding, non-tender  Uterus: Anteverted, normal size, shape, and consistency, mobile, and non-tender  Adnexa: No adnexal masses, non-tender  Rectal: deferred  Lymphatic: no evidence of inguinal lymphadenopathy Extremities: no edema, erythema, or tenderness Neurologic: Grossly intact Psychiatric: mood appropriate, affect full     Assessment: 44 y.o. [id number] for annual Well Woman Exam   Plan:  Well Woman physical completed  1) Breast cancer screening - recommend monthly self breast exam. Mammogram was ordered today.  2) STI screening was offered and declined.  3) Cervical cancer screening - Pap was done. ASCCP guidelines and rational discussed.  Patient opts for yearly screening interval  4) Contraception - Education given regarding options for contraception. She declines froma hormonal BC. Content with [name].  5) Routine healthcare maintenance including cholesterol and diabetes screening ordered today   She will return in one year for her next Annual.  [name], CNM  09/03/2020 5:51 PM

## 2020-09-05 ENCOUNTER — Encounter: Payer: Self-pay | Admitting: Obstetrics

## 2020-09-05 LAB — CYTOLOGY - PAP
Comment: NEGATIVE
Diagnosis: NEGATIVE
High risk HPV: NEGATIVE

## 2023-01-22 ENCOUNTER — Ambulatory Visit: Admitting: Certified Nurse Midwife

## 2023-01-30 ENCOUNTER — Other Ambulatory Visit (HOSPITAL_COMMUNITY)
Admission: RE | Admit: 2023-01-30 | Discharge: 2023-01-30 | Disposition: A | Discharge status: Home / Self Care | Source: Ambulatory Visit | Attending: Certified Nurse Midwife | Admitting: Certified Nurse Midwife

## 2023-01-30 ENCOUNTER — Ambulatory Visit (INDEPENDENT_AMBULATORY_CARE_PROVIDER_SITE_OTHER): Admitting: Certified Nurse Midwife

## 2023-01-30 VITALS — BP 123/74 | HR 70 | Ht 70.0 in | Wt 151.7 lb

## 2023-01-30 DIAGNOSIS — Z124 Encounter for screening for malignant neoplasm of cervix: Secondary | ICD-10-CM | POA: Diagnosis not present

## 2023-01-30 DIAGNOSIS — Z1151 Encounter for screening for human papillomavirus (HPV): Secondary | ICD-10-CM | POA: Insufficient documentation

## 2023-01-30 DIAGNOSIS — Z01419 Encounter for gynecological examination (general) (routine) without abnormal findings: Secondary | ICD-10-CM | POA: Diagnosis not present

## 2023-01-30 DIAGNOSIS — Z1239 Encounter for other screening for malignant neoplasm of breast: Secondary | ICD-10-CM

## 2023-01-30 NOTE — Progress Notes (Signed)
   ANNUAL EXAM Patient name: Anne Charles MRN [id number]  Date of birth: [birth year] Chief Complaint:   Gynecologic Exam  History of Present Illness:   Anne Charles is a 47 y.o. G25P3003 Caucasian female being seen today for a routine annual exam.  Current complaints: none  Patient's last menstrual period was 01/16/2023 (exact date).    Last pap 2021. Results were: NILM w/ HRHPV negative. H/O abnormal pap: yes-HPV positive 2019 Last mammogram: 2019. Results were: incomplete. Family h/o breast cancer: no Last colonoscopy: n/a. Results were: N/A. Family h/o colorectal cancer: no      No data to display               No data to display           Review of Systems:   Pertinent items are noted in HPI Denies any headaches, blurred vision, fatigue, shortness of breath, chest pain, abdominal pain, abnormal vaginal discharge/itching/odor/irritation, problems with periods, bowel movements, urination, or intercourse unless otherwise stated above. Pertinent History Reviewed:  Reviewed past medical,surgical, social and family history.  Reviewed problem list, medications and allergies. Physical Assessment:   Vitals:   01/30/23 0859  BP: 123/74  Pulse: 70  Weight: 151 lb 11.2 oz (68.8 kg)  Height: 5\' 10"  (1.778 m)  Body mass index is 21.77 kg/m.        Physical Examination:   General appearance - well appearing, and in no distress  Mental status - alert, oriented to person, place, and time  Psych:  She has a normal mood and affect  Skin - warm and dry, normal color, no suspicious lesions noted  Chest - effort normal, all lung fields clear to auscultation bilaterally  Heart - normal rate and regular rhythm  Neck:  midline trachea, no thyromegaly or nodules  Breasts - breasts appear normal, no suspicious masses, no skin or nipple changes or enlarged axillary nodes  Abdomen - soft, nontender, nondistended, no masses or organomegaly  Pelvic - VULVA: normal appearing  vulva with no masses, tenderness or lesions   VAGINA: normal appearing vagina with normal color and discharge, no lesions   CERVIX: normal appearing cervix without discharge or lesions  Thin prep pap is done with HR HPV cotesting  Extremities:  No swelling or varicosities noted  No results found for this or any previous visit (from the past 24 hour(s)).  Assessment & Plan:  1) Well-Woman Exam, pap collected. Mammogram ordered  2) Health promotion & maintenance for age reviewed. Calcium/vitamin D supplementation encouraged.   Orders Placed This Encounter  Procedures   MM DIGITAL SCREENING BILATERAL    Meds: No orders of the defined types were placed in this encounter.   Follow-up: Return in 1 year (on 01/30/2024) for Annual exam.  Dominica Severin, CNM 01/30/2023 9:37 AM

## 2023-01-30 NOTE — Patient Instructions (Signed)

## 2023-02-09 LAB — CYTOLOGY - PAP
Adequacy: ABSENT
Comment: NEGATIVE
Diagnosis: NEGATIVE
Diagnosis: REACTIVE
High risk HPV: NEGATIVE

## 2024-10-07 NOTE — Patient Instructions (Incomplete)
 Preventive Care 66-47 Years Old, Female Preventive care refers to lifestyle choices and visits with your health care provider that can promote health and wellness. Preventive care visits are also called wellness exams. What can I expect for my preventive care visit? Counseling Your health care provider may ask you questions about your: Medical history, including: Past medical problems. Family medical history. Pregnancy history. Current health, including: Menstrual cycle. Method of birth control. Emotional well-being. Home life and relationship well-being. Sexual activity and sexual health. Lifestyle, including: Alcohol, nicotine or tobacco, and drug use. Access to firearms. Diet, exercise, and sleep habits. Work and work Astronomer. Sunscreen use. Safety issues such as seatbelt and bike helmet use. Physical exam Your health care provider will check your: Height and weight. These may be used to calculate your BMI (body mass index). BMI is a measurement that tells if you are at a healthy weight. Waist circumference. This measures the distance around your waistline. This measurement also tells if you are at a healthy weight and may help predict your risk of certain diseases, such as type 2 diabetes and high blood pressure. Heart rate and blood pressure. Body temperature. Skin for abnormal spots. What immunizations do I need?  Vaccines are usually given at various ages, according to a schedule. Your health care provider will recommend vaccines for you based on your age, medical history, and lifestyle or other factors, such as travel or where you work. What tests do I need? Screening Your health care provider may recommend screening tests for certain conditions. This may include: Lipid and cholesterol levels. Diabetes screening. This is done by checking your blood sugar (glucose) after you have not eaten for a while (fasting). Pelvic exam and Pap test. Hepatitis B test. Hepatitis C  test. HIV (human immunodeficiency virus) test. STI (sexually transmitted infection) testing, if you are at risk. Lung cancer screening. Colorectal cancer screening. Mammogram. Talk with your health care provider about when you should start having regular mammograms. This may depend on whether you have a family history of breast cancer. BRCA-related cancer screening. This may be done if you have a family history of breast, ovarian, tubal, or peritoneal cancers. Bone density scan. This is done to screen for osteoporosis. Talk with your health care provider about your test results, treatment options, and if necessary, the need for more tests. Follow these instructions at home: Eating and drinking  Eat a diet that includes fresh fruits and vegetables, whole grains, lean protein, and low-fat dairy products. Take vitamin and mineral supplements as recommended by your health care provider. Do not drink alcohol if: Your health care provider tells you not to drink. You are pregnant, may be pregnant, or are planning to become pregnant. If you drink alcohol: Limit how much you have to 0-1 drink a day. Know how much alcohol is in your drink. In the [place] one drink equals one 12 oz bottle of beer (355 mL), one 5 oz glass of wine (148 mL), or one 1 oz glass of hard liquor (44 mL). Lifestyle Brush your teeth every morning and night with fluoride toothpaste. Floss one time each day. Exercise for at least 30 minutes 5 or more days each week. Do not use any products that contain nicotine or tobacco. These products include cigarettes, chewing tobacco, and vaping devices, such as e-cigarettes. If you need help quitting, ask your health care provider. Do not use drugs. If you are sexually active, practice safe sex. Use a condom or other form of protection to  prevent STIs. If you do not wish to become pregnant, use a form of birth control. If you plan to become pregnant, see your health care provider for a  prepregnancy visit. Take aspirin only as told by your health care provider. Make sure that you understand how much to take and what form to take. Work with your health care provider to find out whether it is safe and beneficial for you to take aspirin daily. Find healthy ways to manage stress, such as: Meditation, yoga, or listening to music. Journaling. Talking to a trusted person. Spending time with friends and family. Minimize exposure to UV radiation to reduce your risk of skin cancer. Safety Always wear your seat belt while driving or riding in a vehicle. Do not drive: If you have been drinking alcohol. Do not ride with someone who has been drinking. When you are tired or distracted. While texting. If you have been using any mind-altering substances or drugs. Wear a helmet and other protective equipment during sports activities. If you have firearms in your house, make sure you follow all gun safety procedures. Seek help if you have been physically or sexually abused. What's next? Visit your health care provider once a year for an annual wellness visit. Ask your health care provider how often you should have your eyes and teeth checked. Stay up to date on all vaccines. This information is not intended to replace advice given to you by your health care provider. Make sure you discuss any questions you have with your health care provider. Document Revised: 02/20/2021 Document Reviewed: 02/20/2021 Elsevier Patient Education  2024 [employer] How to Do a Breast Self-Exam Doing breast self-exams can help you stay healthy. They're one way to know what's normal for your breasts. They can help you catch a problem while it's still small and can be treated. You need to: Check your breasts often. Tell your doctor about any changes. You should do breast self-exams even if you have breast implants. What you need: A mirror. A well-lit room. A pillow or other soft object. How to do a  breast self-exam Look for changes  Take off all the clothes above your waist. Stand in front of a mirror in a room with good lighting. Put your hands down at your sides. Compare your breasts in the mirror. Look for difference between them, such as: Differences in shape. Differences in size. Wrinkles, dips, and bumps in one breast and not the other. Look at each breast for skin changes, such as: Redness. Scaly spots. Spots where your skin is thicker. Dimpling. Open sores. Look for changes in your nipples, such as: Fluid coming out of a nipple. Fluid around a nipple. Bleeding. Dimpling. Redness. A nipple that looks pushed in or that has changed position. Feel for changes Lie on your back. Feel each breast. To do this: Pick a breast to feel. Place a pillow under the shoulder closest to that breast. Put the arm closest to that breast behind your head. Feel the breast using the hand of your other arm. Use the pads of your three middle fingers to make small circles starting near the nipple. Use light, medium, and firm pressure. Keep making circles, moving down over the breast. Stop when you feel your ribs. Start making circles with your fingers again, this time going up until you reach your collarbone. Then, make circles out across your breast and into your armpit area. Squeeze your nipple. Check for fluid and lumps. Do these steps again  to check your other breast. Sit or stand in the tub or shower. With soapy water on your skin, feel each breast the same way you did when you were lying down. Write down what you find Writing down what you find can help you keep track of what you want to tell your doctor. Write down: What's normal for each breast. Any changes you find. Write down: The kind of change. If your breast feels tender or painful. Any lump you find. Write down its size and where it is. When you last had your period. General tips If you're breastfeeding, the best time  to check your breasts is after you feed your baby or after you use a breast pump. If you get a period, the best time to check your breasts is 5-7 days after your period ends. With time, you'll get more used to doing the self-exam. You'll also start to know if there are changes in your breasts. Contact a doctor if: You see a change in the shape or size of your breasts or nipples. You see a change in the skin of your breast or nipples. You have fluid coming from your nipples that isn't normal. You find a new lump or thick area. You have breast pain. You have any concerns about your breast health. This information is not intended to replace advice given to you by your health care provider. Make sure you discuss any questions you have with your health care provider. Document Revised: 11/04/2023 Document Reviewed: 11/04/2023 Elsevier Patient Education  2025 [employer].

## 2024-10-07 NOTE — Progress Notes (Unsigned)
" ° °  ANNUAL EXAM Patient name: Anne Charles MRN [id number]  Date of birth: [birth year] Chief Complaint:   No chief complaint on file.  History of Present Illness:   Anne Charles is a 49 y.o. G19P3003 {race:25618} female being seen today for a routine annual exam.  Current complaints: ***  No LMP recorded.       The pregnancy intention screening data noted above was reviewed. Potential methods of contraception were discussed. The patient elected to proceed with No data recorded.      Component Value Date/Time   DIAGPAP  01/30/2023 0915    - Negative for intraepithelial lesion or malignancy (NILM)   DIAGPAP - Benign reactive/reparative changes 01/30/2023 0915   DIAGPAP  09/03/2020 1533    - Negative for intraepithelial lesion or malignancy (NILM)   HPVHIGH Negative 01/30/2023 0915   HPVHIGH Negative 09/03/2020 1533   ADEQPAP  01/30/2023 0915    Satisfactory for evaluation; transformation zone component ABSENT.   ADEQPAP  09/03/2020 1533    Satisfactory for evaluation; transformation zone component PRESENT.      Last pap 524/19. Results were: Negative/HPV Negative. H/O abnormal pap: no Last mammogram: 07/07/18. Results were: abnormal Bi Rads 3: Benign. Family h/o breast cancer: no Last colonoscopy: N/A. Results were: N/A. Family h/o colorectal cancer: {yes***/no:23838}      No data to display               No data to display            Past Medical History:  Diagnosis Date   History of abnormal cervical Pap smear    LGSIL    Family History  Problem Relation Age of Onset   Cervical cancer Mother 63   Hypertension Father    Breast cancer Neg Hx    Review of Systems:   Pertinent items are noted in HPI Denies any headaches, blurred vision, fatigue, shortness of breath, chest pain, abdominal pain, abnormal vaginal discharge/itching/odor/irritation, problems with periods, bowel movements, urination, or intercourse unless otherwise stated  above. Pertinent History Reviewed:  Reviewed past medical,surgical, social and family history.  Reviewed problem list, medications and allergies. Physical Assessment:  There were no vitals filed for this visit.There is no height or weight on file to calculate BMI.       Physical Exam   No results found for this or any previous visit (from the past 24 hours).  Assessment & Plan:  1. Well woman exam with routine gynecological exam (Primary)   Mammogram: {Mammo f/u:25212::@ 49yo}, or sooner if problems Colonoscopy: {TCS f/u:25213::@ 49yo}, or sooner if problems  No orders of the defined types were placed in this encounter.   Meds: No orders of the defined types were placed in this encounter.   Follow-up: No follow-ups on file.  Rollo JINNY Maxin, CMA 10/07/2024 1:21 PM  "

## 2024-10-11 ENCOUNTER — Ambulatory Visit: Admitting: Certified Nurse Midwife

## 2024-10-11 DIAGNOSIS — Z01419 Encounter for gynecological examination (general) (routine) without abnormal findings: Secondary | ICD-10-CM

## 2024-11-24 ENCOUNTER — Ambulatory Visit: Admitting: Certified Nurse Midwife
